# Patient Record
Sex: Male | Born: 1995 | Race: Black or African American | Hispanic: No | Marital: Single | Smoking: Never smoker
Health system: Southern US, Community
[De-identification: ages and names within clinical notes are randomized; demographics above are authoritative.]

## PROBLEM LIST (undated history)

## (undated) HISTORY — PX: OTHER SURGICAL HISTORY: SHX169

---

## 2016-11-28 ENCOUNTER — Encounter (HOSPITAL_COMMUNITY): Payer: Self-pay | Admitting: Emergency Medicine

## 2016-11-28 ENCOUNTER — Ambulatory Visit (HOSPITAL_COMMUNITY)
Admission: EM | Admit: 2016-11-28 | Discharge: 2016-11-28 | Disposition: A | Discharge status: Home / Self Care | Payer: BLUE CROSS/BLUE SHIELD | Attending: Internal Medicine | Admitting: Internal Medicine

## 2016-11-28 DIAGNOSIS — R5381 Other malaise: Secondary | ICD-10-CM | POA: Insufficient documentation

## 2016-11-28 DIAGNOSIS — H538 Other visual disturbances: Secondary | ICD-10-CM | POA: Diagnosis not present

## 2016-11-28 DIAGNOSIS — F430 Acute stress reaction: Secondary | ICD-10-CM

## 2016-11-28 DIAGNOSIS — R51 Headache: Secondary | ICD-10-CM | POA: Insufficient documentation

## 2016-11-28 DIAGNOSIS — R5383 Other fatigue: Secondary | ICD-10-CM | POA: Insufficient documentation

## 2016-11-28 LAB — CBC WITH DIFFERENTIAL/PLATELET
Basophils Absolute: 0 10*3/uL (ref 0.0–0.1)
Basophils Relative: 1 %
EOS ABS: 0.2 10*3/uL (ref 0.0–0.7)
Eosinophils Relative: 5 %
HCT: 42.9 % (ref 39.0–52.0)
HEMOGLOBIN: 14.8 g/dL (ref 13.0–17.0)
LYMPHS ABS: 1.7 10*3/uL (ref 0.7–4.0)
Lymphocytes Relative: 47 %
MCH: 30.2 pg (ref 26.0–34.0)
MCHC: 34.5 g/dL (ref 30.0–36.0)
MCV: 87.6 fL (ref 78.0–100.0)
MONOS PCT: 11 %
Monocytes Absolute: 0.4 10*3/uL (ref 0.1–1.0)
NEUTROS PCT: 36 %
Neutro Abs: 1.3 10*3/uL — ABNORMAL LOW (ref 1.7–7.7)
Platelets: 258 10*3/uL (ref 150–400)
RBC: 4.9 MIL/uL (ref 4.22–5.81)
RDW: 12.8 % (ref 11.5–15.5)
WBC: 3.5 10*3/uL — ABNORMAL LOW (ref 4.0–10.5)

## 2016-11-28 LAB — COMPREHENSIVE METABOLIC PANEL
ALK PHOS: 95 U/L (ref 38–126)
ALT: 32 U/L (ref 17–63)
ANION GAP: 8 (ref 5–15)
AST: 24 U/L (ref 15–41)
Albumin: 4.6 g/dL (ref 3.5–5.0)
BILIRUBIN TOTAL: 0.6 mg/dL (ref 0.3–1.2)
BUN: 15 mg/dL (ref 6–20)
CALCIUM: 10.1 mg/dL (ref 8.9–10.3)
CO2: 28 mmol/L (ref 22–32)
Chloride: 103 mmol/L (ref 101–111)
Creatinine, Ser: 0.96 mg/dL (ref 0.61–1.24)
GFR calc non Af Amer: >60 mL/min (ref >60)
Glucose, Bld: 73 mg/dL (ref 65–99)
Potassium: 4.4 mmol/L (ref 3.5–5.1)
SODIUM: 139 mmol/L (ref 135–145)
TOTAL PROTEIN: 7.9 g/dL (ref 6.5–8.1)

## 2016-11-28 LAB — RAPID HIV SCREEN (HIV 1/2 AB+AG)
HIV 1/2 ANTIBODIES: NONREACTIVE
HIV-1 P24 ANTIGEN - HIV24: NONREACTIVE

## 2016-11-28 LAB — TSH: TSH: 1.088 u[IU]/mL (ref 0.350–4.500)

## 2016-11-28 NOTE — Discharge Instructions (Addendum)
Lab results will be back in the next day or two.  We will contact you if any change in therapy is needed.  Be sure to eat regular meals and try to get some exercise a few days/week.  This will help you sleep better.  Avoid reliance on substances to relax.  Establish care with a primary care provider to guide further evaluation of your symptoms.

## 2016-11-28 NOTE — ED Provider Notes (Signed)
[facility]    CSN: [id number] Arrival date & time: 11/28/16  1344     History   Chief Complaint Chief Complaint  Patient presents with  . Fatigue    HPI Kevin Spencer is a 21 y.o. male. Presents today with a couple months history of gradual onset of malaise and fatigue, actually he says he's been tired since he got to college. Equivocal weight loss, thinks he might have lost 2 or 3 pounds. In the last month or 2 he has taken on one and then another job in addition to his course studies. He has trouble sleeping sometimes and feels a little tense. Sometimes he only eats once or twice a day. Occasional headaches, not severe. Just broke up with his girlfriend, long distance relationship. Not particularly feeling sad, although occasional moments of sadness. No suicidal or homicidal ideation. Equivocal thirst. Equivocal blurry vision.    HPI  History reviewed. No pertinent past medical history.  Past Surgical History:  Procedure Laterality Date  . testicular          Home Medications    Takes no meds regularly  Family History History reviewed. No pertinent family history.  Social History Social History  Substance Use Topics  . Smoking status: Never Smoker  . Smokeless tobacco: Never Used  . Alcohol use No     Allergies   Patient has no known allergies.   Review of Systems Review of Systems  All other systems reviewed and are negative.    Physical Exam Triage Vital Signs ED Triage Vitals [11/28/16 1403]  Enc Vitals Group     BP (!) 99/46     Pulse Rate 75     Resp 16     Temp 98 F (36.7 C)     Temp Source Oral     SpO2 100 %     Weight      Height      Pain Score 0   Updated Vital Signs BP (!) 99/46 (BP Location: Right Arm)   Pulse 75   Temp 98 F (36.7 C) (Oral)   Resp 16   SpO2 100%   Physical Exam  Constitutional: He is oriented to person, place, and time. No distress.  Alert, nicely groomed  HENT:  Head: Atraumatic.    Eyes:  Conjugate gaze, no eye redness/drainage  Neck: Neck supple.  Cardiovascular: Normal rate and regular rhythm.   Pulmonary/Chest: No respiratory distress. He has no wheezes. He has no rales.  Lungs clear, symmetric breath sounds  Abdominal: Soft. He exhibits no distension. There is no tenderness. There is no guarding.  Musculoskeletal: Normal range of motion.  Neurological: He is alert and oriented to person, place, and time.  Walked into the urgent care independently, able to climb on/off the exam table. Face is symmetric, speech is clear/coherent  Skin: Skin is warm and dry.  No cyanosis  Nursing note and vitals reviewed.    UC Treatments / Results  Labs  Results for orders placed or performed during the hospital encounter of 11/28/16  CBC with Differential  Result Value Ref Range   WBC 3.5 (L) 4.0 - 10.5 K/uL   RBC 4.90 4.22 - 5.81 MIL/uL   Hemoglobin 14.8 13.0 - 17.0 g/dL   HCT 82.942.9 56.239.0 - 13.052.0 %   MCV 87.6 78.0 - 100.0 fL   MCH 30.2 26.0 - 34.0 pg   MCHC 34.5 30.0 - 36.0 g/dL   RDW 86.512.8 78.411.5 - 69.615.5 %  Platelets 258 150 - 400 K/uL   Neutrophils Relative % 36 %   Neutro Abs 1.3 (L) 1.7 - 7.7 K/uL   Lymphocytes Relative 47 %   Lymphs Abs 1.7 0.7 - 4.0 K/uL   Monocytes Relative 11 %   Monocytes Absolute 0.4 0.1 - 1.0 K/uL   Eosinophils Relative 5 %   Eosinophils Absolute 0.2 0.0 - 0.7 K/uL   Basophils Relative 1 %   Basophils Absolute 0.0 0.0 - 0.1 K/uL  Comprehensive metabolic panel  Result Value Ref Range   Sodium 139 135 - 145 mmol/L   Potassium 4.4 3.5 - 5.1 mmol/L   Chloride 103 101 - 111 mmol/L   CO2 28 22 - 32 mmol/L   Glucose, Bld 73 65 - 99 mg/dL   BUN 15 6 - 20 mg/dL   Creatinine, Ser 4.09 0.61 - 1.24 mg/dL   Calcium 81.1 8.9 - 91.4 mg/dL   Total Protein 7.9 6.5 - 8.1 g/dL   Albumin 4.6 3.5 - 5.0 g/dL   AST 24 15 - 41 U/L   ALT 32 17 - 63 U/L   Alkaline Phosphatase 95 38 - 126 U/L   Total Bilirubin 0.6 0.3 - 1.2 mg/dL   GFR calc non Af Amer  >60 >60 mL/min   GFR calc Af Amer >60 >60 mL/min   Anion gap 8 5 - 15  Rapid HIV screen (HIV 1/2 Ab+Ag)  Result Value Ref Range   HIV-1 P24 Antigen - HIV24 NON REACTIVE NON REACTIVE   HIV 1/2 Antibodies NON REACTIVE NON REACTIVE   Interpretation (HIV Ag Ab)      A non reactive test result means that HIV 1 or HIV 2 antibodies and HIV 1 p24 antigen were not detected in the specimen.  TSH  Result Value Ref Range   TSH 1.088 0.350 - 4.500 uIU/mL    Procedures Procedures (including critical care time) None today   Final Clinical Impressions(s) / UC Diagnoses   Final diagnoses:  Acute reaction to situational stress  Fatigue, unspecified type   Lab results will be back in the next day or two.  We will contact you if any change in therapy is needed.  Be sure to eat regular meals and try to get some exercise a few days/week.  This will help you sleep better.  Avoid reliance on substances to relax.  Establish care with a primary care provider to guide further evaluation of your symptoms.      Eustace Moore, MD 11/29/16 [phone]

## 2016-11-28 NOTE — ED Triage Notes (Signed)
[facility] with a complaint of possible weight loss, fatigue and general malaise x 2 months.

## 2016-11-28 NOTE — ED Notes (Signed)
Verified patient's phone number.

## 2017-06-29 ENCOUNTER — Emergency Department (HOSPITAL_COMMUNITY)
Admission: EM | Admit: 2017-06-29 | Discharge: 2017-06-30 | Disposition: A | Discharge status: Home / Self Care | Payer: BLUE CROSS/BLUE SHIELD | Attending: Emergency Medicine | Admitting: Emergency Medicine

## 2017-06-29 ENCOUNTER — Encounter (HOSPITAL_COMMUNITY): Payer: Self-pay | Admitting: Emergency Medicine

## 2017-06-29 DIAGNOSIS — J069 Acute upper respiratory infection, unspecified: Secondary | ICD-10-CM | POA: Diagnosis not present

## 2017-06-29 DIAGNOSIS — R519 Headache, unspecified: Secondary | ICD-10-CM

## 2017-06-29 DIAGNOSIS — R51 Headache: Secondary | ICD-10-CM | POA: Diagnosis present

## 2017-06-29 LAB — CBC
HEMATOCRIT: 39.4 % (ref 39.0–52.0)
Hemoglobin: 14 g/dL (ref 13.0–17.0)
MCH: 30.6 pg (ref 26.0–34.0)
MCHC: 35.5 g/dL (ref 30.0–36.0)
MCV: 86 fL (ref 78.0–100.0)
PLATELETS: 302 10*3/uL (ref 150–400)
RBC: 4.58 MIL/uL (ref 4.22–5.81)
RDW: 12.3 % (ref 11.5–15.5)
WBC: 10.5 10*3/uL (ref 4.0–10.5)

## 2017-06-29 MED ORDER — SODIUM CHLORIDE 0.9 % IV BOLUS (SEPSIS)
1000.0000 mL | Freq: Once | INTRAVENOUS | Status: AC
  Administered 2017-06-29: 1000 mL via INTRAVENOUS

## 2017-06-29 MED ORDER — KETOROLAC TROMETHAMINE 30 MG/ML IJ SOLN
30.0000 mg | Freq: Once | INTRAMUSCULAR | Status: AC
  Administered 2017-06-29: 30 mg via INTRAVENOUS
  Filled 2017-06-29: qty 1

## 2017-06-29 MED ORDER — DIPHENHYDRAMINE HCL 50 MG/ML IJ SOLN
25.0000 mg | Freq: Once | INTRAMUSCULAR | Status: AC
Start: 2017-06-29 — End: 2017-06-29
  Administered 2017-06-29: 25 mg via INTRAVENOUS
  Filled 2017-06-29: qty 1

## 2017-06-29 MED ORDER — METOCLOPRAMIDE HCL 5 MG/ML IJ SOLN
10.0000 mg | INTRAMUSCULAR | Status: AC
  Administered 2017-06-29: 10 mg via INTRAVENOUS
  Filled 2017-06-29: qty 2

## 2017-06-29 NOTE — ED Triage Notes (Signed)
Pt complaint of headache with associated nausea, fever, and light sensitivity onset yesterday.

## 2017-06-29 NOTE — ED Notes (Signed)
ED Provider at bedside. 

## 2017-06-29 NOTE — ED Provider Notes (Signed)
[facility] Provider Note   CSN: [id number] Arrival date & time: 06/29/17  1559     History   Chief Complaint Chief Complaint  Patient presents with  . Headache    HPI Kevin Spencer is a 21 y.o. male.  21 year old male presents to the emergency department for evaluation of headache. He reports a frontal and temporal, throbbing headache which has been constant since yesterday. The patient has been taking aspirin for symptoms without relief. He notes associated photophobia and phonophobia. Symptoms preceded by upper respiratory infection. He notes onset of nasal congestion 5 days ago. This was followed by a fever up to 103F. He had development of a cough as well as a sore throat. He has tried over-the-counter remedies for this without relief. Patient denies any fever over 100.68F since onset of headache yesterday. He has had no vision changes, vision loss, hearing changes, vomiting, extremity numbness or paresthesias, extremity weakness. He does report a hx of migraines, but states this feels worse.      History reviewed. No pertinent past medical history.  There are no active problems to display for this patient.   Past Surgical History:  Procedure Laterality Date  . testicular         Home Medications    Prior to Admission medications   Medication Sig Start Date End Date Taking? Authorizing Provider  benzonatate (TESSALON) 100 MG capsule Take 1 capsule (100 mg total) by mouth 3 (three) times daily as needed for cough. 06/30/17   Antony Madura, PA-C  butalbital-acetaminophen-caffeine (FIORICET, ESGIC) [phone] MG tablet Take 1-2 tablets by mouth every 8 (eight) hours as needed for headache. 06/30/17 06/30/18  Antony Madura, PA-C  cetirizine (ZYRTEC ALLERGY) 10 MG tablet Take 1 tablet (10 mg total) by mouth daily. 06/30/17   Antony Madura, PA-C    Family History No family history on file.  Social History Social History  Substance Use Topics  . Smoking status: Never  Smoker  . Smokeless tobacco: Never Used  . Alcohol use No     Allergies   Patient has no known allergies.   Review of Systems Review of Systems Ten systems reviewed and are negative for acute change, except as noted in the HPI.    Physical Exam Updated Vital Signs BP 124/70 (BP Location: Right Arm)   Pulse 92   Temp (!) 100.6 F (38.1 C) (Oral)   Resp 16   SpO2 99%   Physical Exam  Constitutional: He is oriented to person, place, and time. He appears well-developed and well-nourished. No distress.  Appears uncomfortable, but nontoxic.  HENT:  Head: Normocephalic and atraumatic.  Mouth/Throat: Oropharynx is clear and moist.  Symmetric rise of the uvula with phonation  Eyes: Pupils are equal, round, and reactive to light. Conjunctivae and EOM are normal. No scleral icterus.  Neck: Normal range of motion.  No meningismus; moving neck freely  Cardiovascular: Regular rhythm and intact distal pulses.   Mild tachycardia  Pulmonary/Chest: Effort normal. No respiratory distress. He has no wheezes.  Respirations even and unlabored  Musculoskeletal: Normal range of motion.  Neurological: He is alert and oriented to person, place, and time. No cranial nerve deficit. He exhibits normal muscle tone. Coordination normal.  GCS 15. Speech is goal oriented. No cranial nerve deficits appreciated; symmetric eyebrow raise, no facial drooping, tongue midline. Patient has equal grip strength bilaterally with 5/5 strength against resistance in all major muscle groups bilaterally. Sensation to light touch intact. Patient moves extremities without ataxia.  Skin: Skin is warm and dry. No rash noted. He is not diaphoretic. No erythema. No pallor.  Psychiatric: He has a normal mood and affect. His behavior is normal.  Nursing note and vitals reviewed.    ED Treatments / Results  Labs (all labs ordered are listed, but only abnormal results are displayed) Labs Reviewed  BASIC METABOLIC PANEL -  Abnormal; Notable for the following:       Result Value   Sodium 134 (*)    Chloride 99 (*)    Glucose, Bld 107 (*)    All other components within normal limits  CBC    EKG  EKG Interpretation None       Radiology No results found.  Procedures Procedures (including critical care time)  Medications Ordered in ED Medications  sodium chloride 0.9 % bolus 1,000 mL (0 mLs Intravenous Stopped 06/30/17 0045)  ketorolac (TORADOL) 30 MG/ML injection 30 mg (30 mg Intravenous Given 06/29/17 2336)  metoCLOPramide (REGLAN) injection 10 mg (10 mg Intravenous Given 06/29/17 2338)  diphenhydrAMINE (BENADRYL) injection 25 mg (25 mg Intravenous Given 06/29/17 2337)  butalbital-acetaminophen-caffeine (FIORICET, ESGIC) 50-325-40 MG per tablet 2 tablet (2 tablets Oral Given 06/30/17 0158)     Initial Impression / Assessment and Plan / ED Course  I have reviewed the triage vital signs and the nursing notes.  Pertinent labs & imaging results that were available during my care of the patient were reviewed by me and considered in my medical decision making (see chart for details).     21 year old male presents to the emergency department for fever, nasal congestion, cough, body aches, and headache. Patient complaining of a migraine in his frontal and temporal region with associated photophobia and phonophobia. Neurologic exam nonfocal. No meningismus or nuchal rigidity on exam. Patient has had significant improvement in his headache with a migraine cocktail and IV fluids. On reassessment, he is nontoxic in appearance and smiling; in NAD. Symptoms c/w flu-like illness. Will continue with further supportive care. Low suspicion for emergent etiology of symptoms at this time. Return precautions discussed and provided. Patient discharged in stable condition with no unaddressed concerns.   Final Clinical Impressions(s) / ED Diagnoses   Final diagnoses:  Viral URI  Bad headache    New  Prescriptions Discharge Medication List as of 06/30/2017  1:39 AM    START taking these medications   Details  benzonatate (TESSALON) 100 MG capsule Take 1 capsule (100 mg total) by mouth 3 (three) times daily as needed for cough., Starting Fri 06/30/2017, Print    butalbital-acetaminophen-caffeine (FIORICET, ESGIC) 50-325-40 MG tablet Take 1-2 tablets by mouth every 8 (eight) hours as needed for headache., Starting Fri 06/30/2017, Until Sat 06/30/2018, Print    cetirizine (ZYRTEC ALLERGY) 10 MG tablet Take 1 tablet (10 mg total) by mouth daily., Starting Fri 06/30/2017, Print         Antony Madura, PA-C 06/30/17 1610    Marily Memos, MD 07/03/17 [phone]

## 2017-06-29 NOTE — ED Notes (Signed)
Pt did not respond when called form triage

## 2017-06-30 ENCOUNTER — Encounter (HOSPITAL_COMMUNITY): Payer: Self-pay

## 2017-06-30 DIAGNOSIS — R51 Headache: Secondary | ICD-10-CM | POA: Insufficient documentation

## 2017-06-30 DIAGNOSIS — J019 Acute sinusitis, unspecified: Secondary | ICD-10-CM

## 2017-06-30 LAB — BASIC METABOLIC PANEL
Anion gap: 11 (ref 5–15)
BUN: 16 mg/dL (ref 6–20)
CHLORIDE: 99 mmol/L — AB (ref 101–111)
CO2: 24 mmol/L (ref 22–32)
CREATININE: 1.06 mg/dL (ref 0.61–1.24)
Calcium: 9.4 mg/dL (ref 8.9–10.3)
Glucose, Bld: 107 mg/dL — ABNORMAL HIGH (ref 65–99)
POTASSIUM: 4.2 mmol/L (ref 3.5–5.1)
SODIUM: 134 mmol/L — AB (ref 135–145)

## 2017-06-30 MED ORDER — CETIRIZINE HCL 10 MG PO TABS: 10.0000 mg | ORAL_TABLET | Freq: Every day | ORAL | 1 refills | Status: AC

## 2017-06-30 MED ORDER — BENZONATATE 100 MG PO CAPS: 100.0000 mg | ORAL_CAPSULE | Freq: Three times a day (TID) | ORAL | 0 refills | Status: AC | PRN

## 2017-06-30 MED ORDER — ACETAMINOPHEN 325 MG PO TABS
650.0000 mg | ORAL_TABLET | Freq: Once | ORAL | Status: AC | PRN
  Administered 2017-06-30: 650 mg via ORAL
  Filled 2017-06-30: qty 2

## 2017-06-30 MED ORDER — BUTALBITAL-APAP-CAFFEINE 50-325-40 MG PO TABS: 1.0000 | ORAL_TABLET | Freq: Three times a day (TID) | ORAL | 0 refills | Status: AC | PRN

## 2017-06-30 MED ORDER — BUTALBITAL-APAP-CAFFEINE 50-325-40 MG PO TABS
2.0000 | ORAL_TABLET | Freq: Once | ORAL | Status: AC
  Administered 2017-06-30: 2 via ORAL
  Filled 2017-06-30: qty 2

## 2017-06-30 NOTE — ED Triage Notes (Signed)
Pt complaining of continued headache, fever, and light sensitivity. He was seen here last night for same and given a Fioricet prescription. He last took that at 8pm. He states that that takes his pain from at 10 to a 5, but it only lasts about 4 hours. A&Ox4. Ambulatory.

## 2017-07-01 ENCOUNTER — Emergency Department (HOSPITAL_COMMUNITY): Payer: BLUE CROSS/BLUE SHIELD

## 2017-07-01 ENCOUNTER — Emergency Department (HOSPITAL_COMMUNITY)
Admission: EM | Admit: 2017-07-01 | Discharge: 2017-07-01 | Disposition: A | Discharge status: Home / Self Care | Payer: BLUE CROSS/BLUE SHIELD | Source: Home / Self Care | Attending: Emergency Medicine | Admitting: Emergency Medicine

## 2017-07-01 DIAGNOSIS — R51 Headache: Principal | ICD-10-CM

## 2017-07-01 DIAGNOSIS — J019 Acute sinusitis, unspecified: Secondary | ICD-10-CM

## 2017-07-01 DIAGNOSIS — R519 Headache, unspecified: Secondary | ICD-10-CM

## 2017-07-01 MED ORDER — AMOXICILLIN 500 MG PO CAPS: 500.0000 mg | ORAL_CAPSULE | Freq: Three times a day (TID) | ORAL | 0 refills | Status: AC

## 2017-07-01 NOTE — Discharge Instructions (Signed)
It was our pleasure to provide your ER care today - we hope that you feel better.  Take amoxicillin as prescribed.  You may try zyrtec d (available over the counter) for symptom relief.  Take ibuprofen and/or acetaminophen as need for pain.  Follow up with primary care doctor in 1 week if symptoms fail to improve/resolve.  Return to ER if worse, other concern.

## 2017-07-01 NOTE — ED Provider Notes (Signed)
[facility] Provider Note   CSN: [id number] Arrival date & time: 06/30/17  2024     History   Chief Complaint Chief Complaint  Patient presents with  . Headache    HPI Kevin Spencer is a 21 y.o. male.  Patient c/o recent uri symptoms with nasal congestion, drainage, scratchy throat, fevers, occasional non prod cough, and intermittent headaches. Patient c/o dull frontal area headaches. Moderate-sev. Dull. Non radiating. No specific exacerbating or alleviating factors. Hx migraines. No eye pain or change in vision. No neck pain or stiffness. No known ill contacts.    The history is provided by the patient.  Headache   Associated symptoms include a fever. Pertinent negatives include no shortness of breath and no vomiting.    History reviewed. No pertinent past medical history.  There are no active problems to display for this patient.   Past Surgical History:  Procedure Laterality Date  . testicular          Home Medications    Prior to Admission medications   Medication Sig Start Date End Date Taking? Authorizing Provider  butalbital-acetaminophen-caffeine (FIORICET, ESGIC) [phone] MG tablet Take 1-2 tablets by mouth every 8 (eight) hours as needed for headache. 06/30/17 06/30/18 Yes Antony Madura, PA-C  cetirizine (ZYRTEC ALLERGY) 10 MG tablet Take 1 tablet (10 mg total) by mouth daily. 06/30/17  Yes Antony Madura, PA-C  benzonatate (TESSALON) 100 MG capsule Take 1 capsule (100 mg total) by mouth 3 (three) times daily as needed for cough. 06/30/17   Antony Madura, PA-C    Family History History reviewed. No pertinent family history.  Social History Social History  Substance Use Topics  . Smoking status: Never Smoker  . Smokeless tobacco: Never Used  . Alcohol use No     Allergies   Patient has no known allergies.   Review of Systems Review of Systems  Constitutional: Positive for fever.  HENT: Positive for congestion, rhinorrhea, sinus pain and sore  throat.   Eyes: Negative for pain, discharge and redness.  Respiratory: Positive for cough. Negative for shortness of breath.   Cardiovascular: Negative for chest pain.  Gastrointestinal: Negative for abdominal pain and vomiting.  Genitourinary: Negative for flank pain.  Musculoskeletal: Negative for neck pain and neck stiffness.  Skin: Negative for rash.  Neurological: Positive for headaches.  Hematological: Does not bruise/bleed easily.  Psychiatric/Behavioral: Negative for confusion.     Physical Exam Updated Vital Signs BP (!) 111/59 (BP Location: Right Arm)   Pulse 67   Temp (!) 100.8 F (38.2 C) (Oral)   Resp 18   SpO2 100%   Physical Exam  Constitutional: He appears well-developed and well-nourished. No distress.  HENT:  Mouth/Throat: Oropharynx is clear and moist.  Nasal congestion. Frontal/max area tenderness. No sts. No skin changes or erythema.  tms normal.   Eyes: Pupils are equal, round, and reactive to light. Conjunctivae and EOM are normal.  Neck: Normal range of motion. Neck supple. No tracheal deviation present.  Pt moves neck freely and comfortably in all directions. No neck stiffness or rigidity.  Cardiovascular: Normal rate, regular rhythm, normal heart sounds and intact distal pulses.  Exam reveals no gallop and no friction rub.   No murmur heard. Pulmonary/Chest: Effort normal and breath sounds normal. No accessory muscle usage. No respiratory distress.  Abdominal: Soft. He exhibits no distension. There is no tenderness.  Musculoskeletal: He exhibits no edema.  Lymphadenopathy:    He has no cervical adenopathy.  Neurological: He is alert.  Speech clear/fluent. Motor intact bil. Steady gait.   Skin: Skin is warm and dry. No rash noted. He is not diaphoretic.  Psychiatric: He has a normal mood and affect.  Nursing note and vitals reviewed.    ED Treatments / Results  Labs (all labs ordered are listed, but only abnormal results are displayed) Labs  Reviewed - No data to display  EKG  EKG Interpretation None       Radiology Ct Head Wo Contrast  Result Date: 07/01/2017 CLINICAL DATA:  Continued headache.  Seen here yesterday for same. EXAM: CT HEAD WITHOUT CONTRAST TECHNIQUE: Contiguous axial images were obtained from the base of the skull through the vertex without intravenous contrast. COMPARISON:  None. FINDINGS: Brain: No evidence of acute infarction, hemorrhage, hydrocephalus, extra-axial collection or mass lesion/mass effect. Vascular: No hyperdense vessel or unexpected calcification. Skull: Normal. Negative for fracture or focal lesion. Sinuses/Orbits: Mucosal thickening in the paranasal sinuses. No acute air-fluid levels. Mastoid air cells are clear. Other: None. IMPRESSION: No acute intracranial abnormalities. Electronically Signed   By: Burman Nieves M.D.   On: 07/01/2017 03:38    Procedures Procedures (including critical care time)  Medications Ordered in ED Medications  acetaminophen (TYLENOL) tablet 650 mg (650 mg Oral Given 06/30/17 2207)     Initial Impression / Assessment and Plan / ED Course  I have reviewed the triage vital signs and the nursing notes.  Pertinent labs & imaging results that were available during my care of the patient were reviewed by me and considered in my medical decision making (see chart for details).  Motrin po. Po fluids.  2nd ED visit w same symptoms, requests imaging study.  Ct ordered.   Reviewed nursing notes and prior charts for additional history.    will give rx amox and rec zyrtec d for symptom relief.    Final Clinical Impressions(s) / ED Diagnoses   Final diagnoses:  None    New Prescriptions New Prescriptions   No medications on file     Cathren Laine, MD 07/01/17 [phone]

## 2017-11-25 DIAGNOSIS — S61211A Laceration without foreign body of left index finger without damage to nail, initial encounter: Secondary | ICD-10-CM | POA: Insufficient documentation

## 2017-11-25 DIAGNOSIS — W260XXA Contact with knife, initial encounter: Secondary | ICD-10-CM | POA: Diagnosis not present

## 2017-11-25 DIAGNOSIS — Z23 Encounter for immunization: Secondary | ICD-10-CM | POA: Insufficient documentation

## 2017-11-25 DIAGNOSIS — Y92511 Restaurant or cafe as the place of occurrence of the external cause: Secondary | ICD-10-CM | POA: Diagnosis not present

## 2017-11-25 DIAGNOSIS — Y93G1 Activity, food preparation and clean up: Secondary | ICD-10-CM | POA: Insufficient documentation

## 2017-11-25 DIAGNOSIS — Y990 Civilian activity done for income or pay: Secondary | ICD-10-CM | POA: Diagnosis not present

## 2017-11-26 ENCOUNTER — Encounter (HOSPITAL_COMMUNITY): Payer: Self-pay | Admitting: Nurse Practitioner

## 2017-11-26 ENCOUNTER — Emergency Department (HOSPITAL_COMMUNITY)
Admission: EM | Admit: 2017-11-26 | Discharge: 2017-11-26 | Disposition: A | Discharge status: Home / Self Care | Payer: Worker's Compensation | Attending: Emergency Medicine | Admitting: Emergency Medicine

## 2017-11-26 DIAGNOSIS — S61211A Laceration without foreign body of left index finger without damage to nail, initial encounter: Secondary | ICD-10-CM

## 2017-11-26 MED ORDER — IBUPROFEN 800 MG PO TABS
800.0000 mg | ORAL_TABLET | Freq: Once | ORAL | Status: AC
  Administered 2017-11-26: 800 mg via ORAL
  Filled 2017-11-26: qty 1

## 2017-11-26 MED ORDER — LIDOCAINE HCL (PF) 1 % IJ SOLN
5.0000 mL | Freq: Once | INTRAMUSCULAR | Status: AC
  Administered 2017-11-26: 5 mL via INTRADERMAL
  Filled 2017-11-26: qty 30

## 2017-11-26 MED ORDER — TETANUS-DIPHTH-ACELL PERTUSSIS 5-2.5-18.5 LF-MCG/0.5 IM SUSP
0.5000 mL | Freq: Once | INTRAMUSCULAR | Status: AC
  Administered 2017-11-26: 0.5 mL via INTRAMUSCULAR
  Filled 2017-11-26: qty 0.5

## 2017-11-26 NOTE — ED Notes (Signed)
AVS explained in detail. Knows to keep finger clean and dry and covered. Knows not to immerse in water or pick at finger. Acknowledges for which symptoms to return. Given work note. Pt is driving himself home. Received 800 mg Ibuprofen.

## 2017-11-26 NOTE — Discharge Instructions (Signed)
A laceration is a cut or lesion that goes through all layers of the skin and into the tissue just beneath the skin. This may have been repaired by your caregiver.  °SEEK MEDICAL ATTENTION IF: °There is redness, swelling, increasing pain in the wound  °There is a red line that goes up your arm or leg.  °Pus is coming from wound.  °You develop an unexplained temperature above 100.4.  °You notice a foul smell coming from the wound or dressing.  °There is a breaking open of the wound (edges not staying together) after sutures have been removed. °If you did not receive a tetanus shot today because you thought you were up to date, but did not recall when your last one was given, make sure to check with your primary caregiver to determine if you need one. ° °

## 2017-11-26 NOTE — ED Triage Notes (Signed)
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

## 2017-11-26 NOTE — ED Provider Notes (Signed)
[facility]-EMERGENCY DEPT Provider Note   CSN: [id number] Arrival date & time: 11/25/17  2251     History   Chief Complaint Chief Complaint  Patient presents with  . Laceration    Left index finger    HPI Kevin Spencer is a 22 y.o. male.  Who presents the emergency department chief complaint of finger laceration.  Patient states that he was preparing dinner about 8 PM last evening when he sliced the distal tip of his left index finger.  He had immediate pain and bleeding.  He washed the wound at home.  He is unsure of his last tetanus vaccination.  He denies any numbness or tingling.  HPI  History reviewed. No pertinent past medical history.  There are no active problems to display for this patient.   Past Surgical History:  Procedure Laterality Date  . testicular          Home Medications    Prior to Admission medications   Medication Sig Start Date End Date Taking? Authorizing Provider  amoxicillin (AMOXIL) 500 MG capsule Take 1 capsule (500 mg total) by mouth 3 (three) times daily. 07/01/17   Cathren LaineSteinl, Kevin, MD  benzonatate (TESSALON) 100 MG capsule Take 1 capsule (100 mg total) by mouth 3 (three) times daily as needed for cough. 06/30/17   Antony MaduraHumes, Kelly, PA-C  butalbital-acetaminophen-caffeine (FIORICET, ESGIC) [phone] MG tablet Take 1-2 tablets by mouth every 8 (eight) hours as needed for headache. 06/30/17 06/30/18  Antony MaduraHumes, Kelly, PA-C  cetirizine (ZYRTEC ALLERGY) 10 MG tablet Take 1 tablet (10 mg total) by mouth daily. 06/30/17   Antony MaduraHumes, Kelly, PA-C    Family History History reviewed. No pertinent family history.  Social History Social History   Tobacco Use  . Smoking status: Never Smoker  . Smokeless tobacco: Never Used  Substance Use Topics  . Alcohol use: No  . Drug use: Yes    Types: Marijuana     Allergies   Patient has no known allergies.   Review of Systems Review of Systems Ten systems reviewed and are negative for acute  change, except as noted in the HPI.    Physical Exam Updated Vital Signs BP 115/68 (BP Location: Right Arm)   Pulse 77   Temp 98 F (36.7 C) (Oral)   Resp 14   SpO2 98%   Physical Exam  Constitutional: He appears well-developed and well-nourished. No distress.  HENT:  Head: Normocephalic and atraumatic.  Eyes: Conjunctivae are normal. No scleral icterus.  Neck: Normal range of motion. Neck supple.  Cardiovascular: Normal rate, regular rhythm and normal heart sounds.  Pulmonary/Chest: Effort normal and breath sounds normal. No respiratory distress.  Abdominal: Soft. There is no tenderness.  Musculoskeletal: He exhibits no edema.  1 cm laceration tip of the left index finger on the radial side of the finger in a horizontal pattern.  Actively bleeding.  No nailbed involvement.  Neurological: He is alert.  Skin: Skin is warm and dry. He is not diaphoretic.  Psychiatric: His behavior is normal.  Nursing note and vitals reviewed.    ED Treatments / Results  Labs (all labs ordered are listed, but only abnormal results are displayed) Labs Reviewed - No data to display  EKG  EKG Interpretation None       Radiology No results found.  Procedures Procedures (including critical care time) LACERATION REPAIR Performed by: Arthor CaptainAbigail Chaston Bradburn Authorized by: Arthor CaptainAbigail Iden Stripling Consent: Verbal consent obtained. Risks and benefits: risks, benefits and alternatives were discussed  Consent given by: patient Patient identity confirmed: provided demographic data Prepped and Draped in normal sterile fashion Wound explored  Laceration Location: Left index finger  Laceration Length: 1 cm  No Foreign Bodies seen or palpated  Anesthesia: local infiltration  Local anesthetic: lidocaine 1 % without epinephrine  Anesthetic total: 5 ml-MCP block  Irrigation method: syringe Amount of cleaning: standard  Skin closure: 5.0 Vicryl  Number of sutures: 6  Technique: Running  interlocking  Patient tolerance: Patient tolerated the procedure well with no immediate complications.  Medications Ordered in ED Medications  lidocaine (PF) (XYLOCAINE) 1 % injection 5 mL (not administered)  Tdap (BOOSTRIX) injection 0.5 mL (not administered)     Initial Impression / Assessment and Plan / ED Course  I have reviewed the triage vital signs and the nursing notes.  Pertinent labs & imaging results that were available during my care of the patient were reviewed by me and considered in my medical decision making (see chart for details).     Kevin Spencer is a 22 y.o. male who presents to ED for laceration of Left index. Wound thoroughly cleaned in ED today. Wound explored and bottom of wound seen in a bloodless field. Laceration repaired as dictated above. Patient counseled on home wound care.Patient was urged to return to the Emergency Department for worsening pain, swelling, expanding erythema especially if it streaks away from the affected area, fever, or for any additional concerns. Patient verbalized understanding. All questions answered.   Final Clinical Impressions(s) / ED Diagnoses   Final diagnoses:  Laceration of left index finger without foreign body without damage to nail, initial encounter    ED Discharge Orders    None       Arthor Captain, PA-C 11/26/17 0520    Dione Booze, MD 11/26/17 [phone]

## 2019-03-07 IMAGING — CT CT HEAD W/O CM
3 of 4 series · 15 of 47 positions shown, 18 images · non-contrast
Comparison: None.

CLINICAL DATA: Continued headache.  Seen here yesterday for same.

EXAM:
CT HEAD WITHOUT CONTRAST
TECHNIQUE: Contiguous axial images were obtained from the base of the skull
through the vertex without intravenous contrast.

[Series 2: head w/o · axial · non-contrast · 0.45mm/px · z∈[-155,-35]mm · 9 of 29 slices shown, 12 images]
[im 3/29  brain]
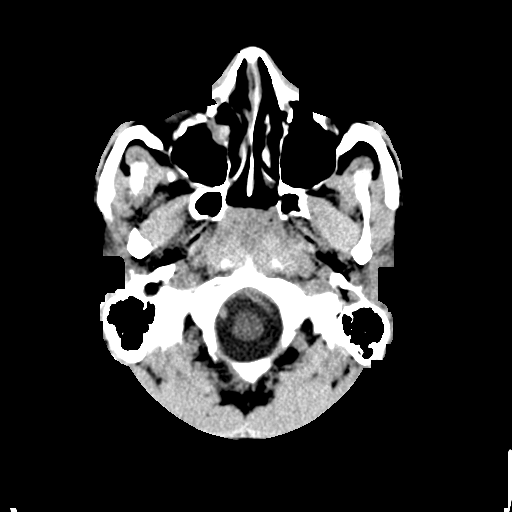
[im 3/29  bone]
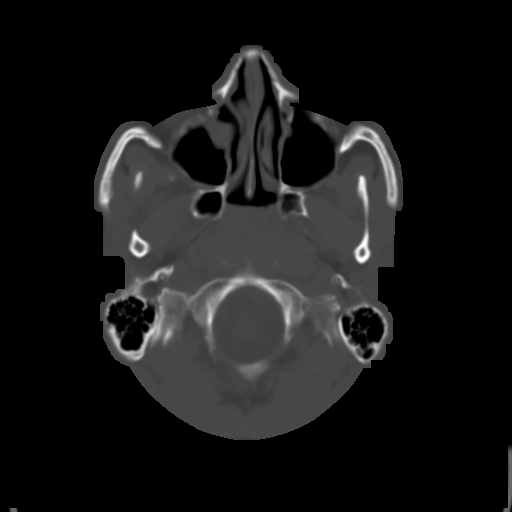
[im 7/29  brain]
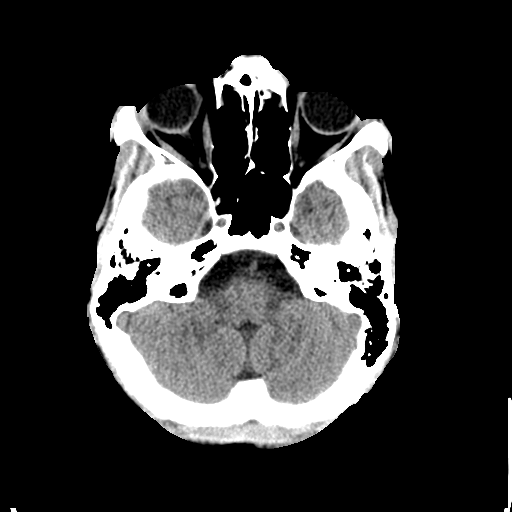
[im 9/29  brain]
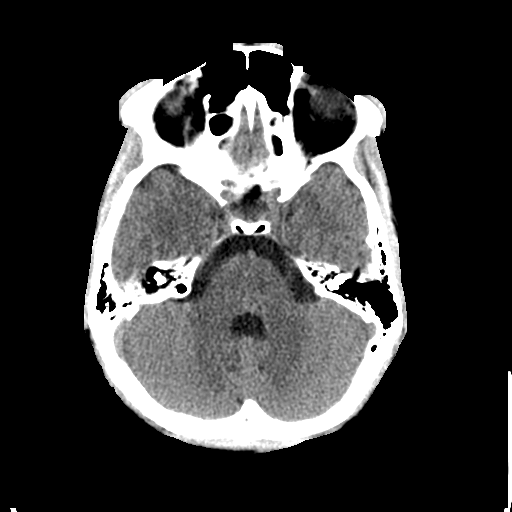
[im 13/29  brain]
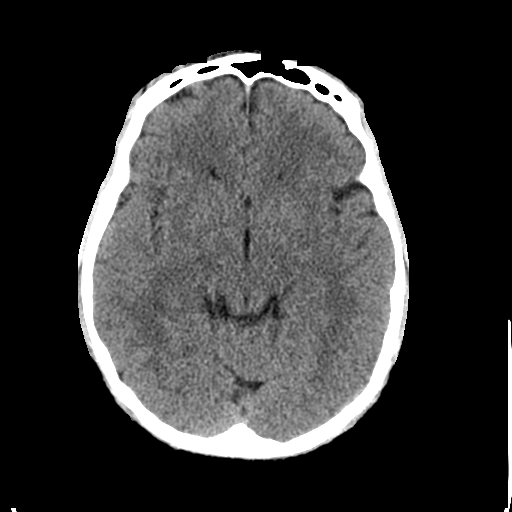
[im 15/29  brain]
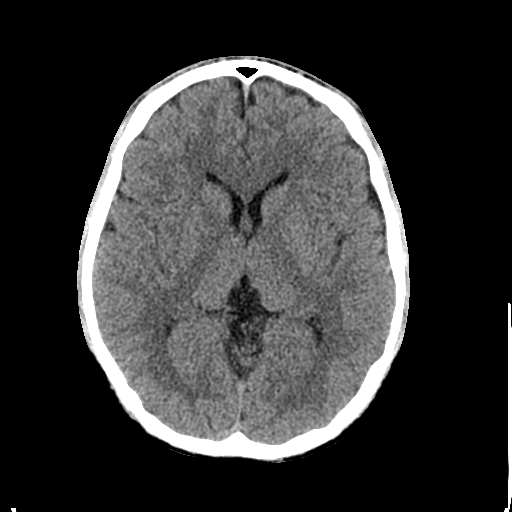
[im 15/29  bone]
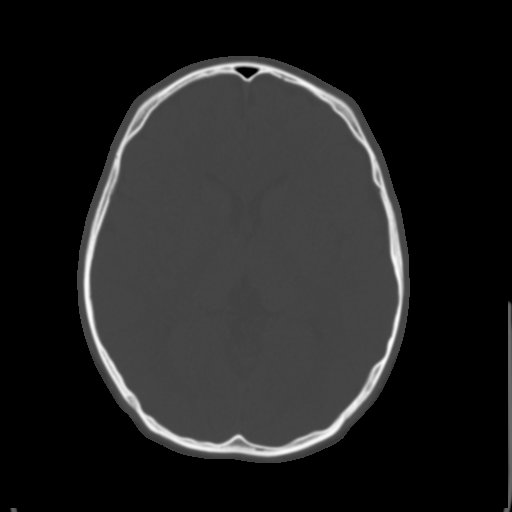
[im 17/29  brain]
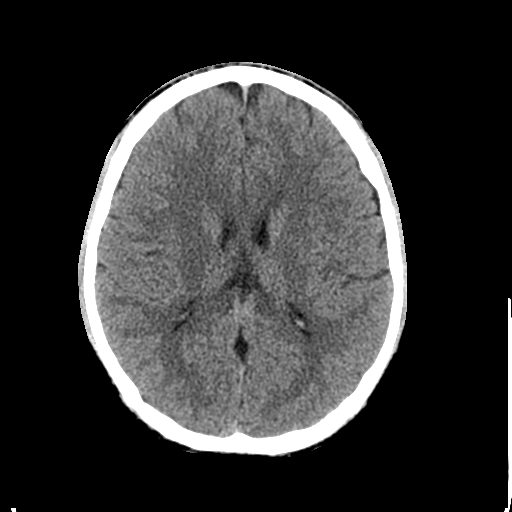
[im 21/29  brain]
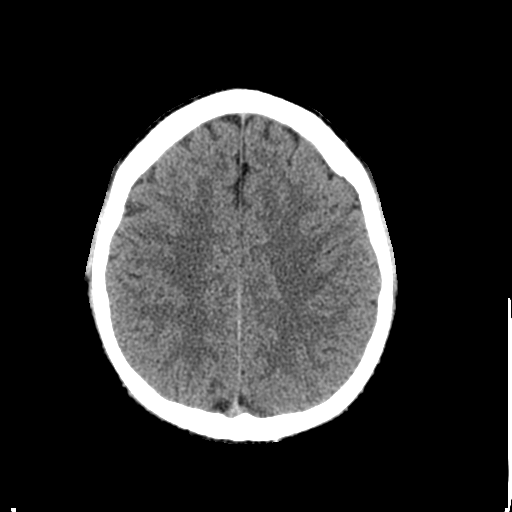
[im 23/29  brain]
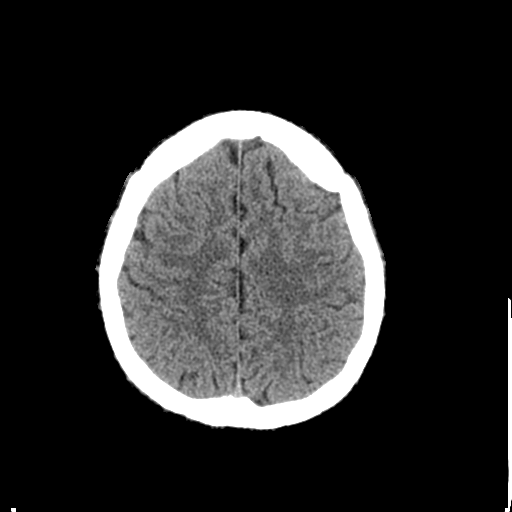
[im 27/29  brain]
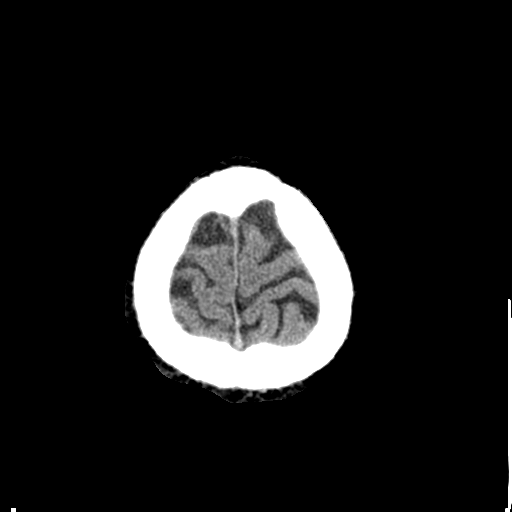
[im 27/29  bone]
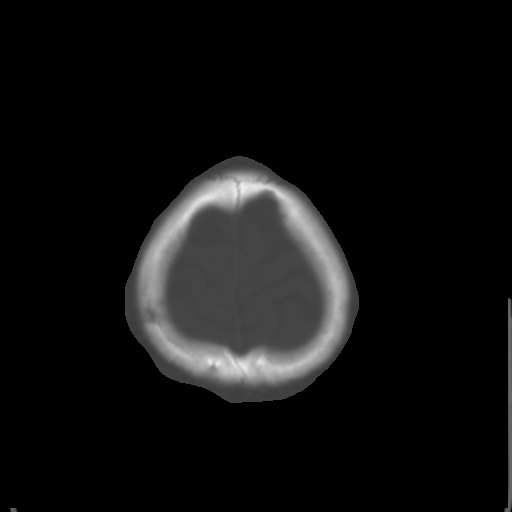

[Series 5: coronal · coronal · 0.28mm/px · 3 of 62 slices shown]
[im 21/62  brain]
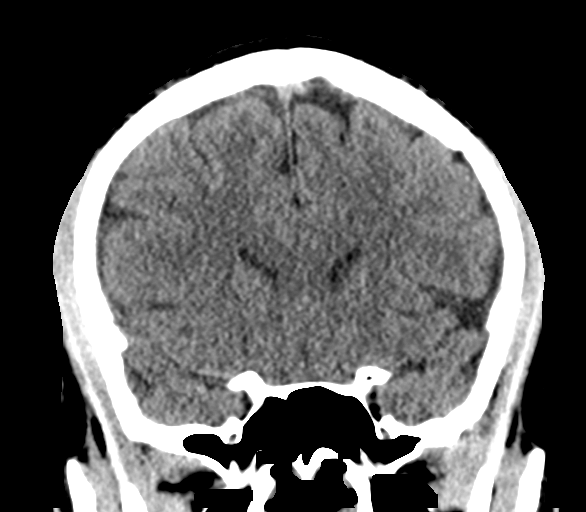
[im 28/62  brain]
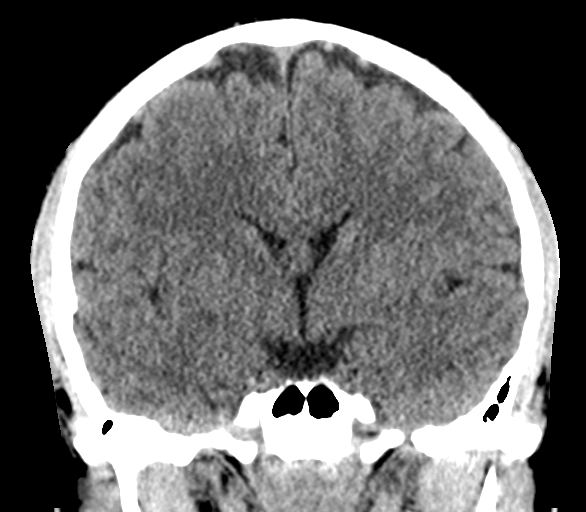
[im 34/62  brain]
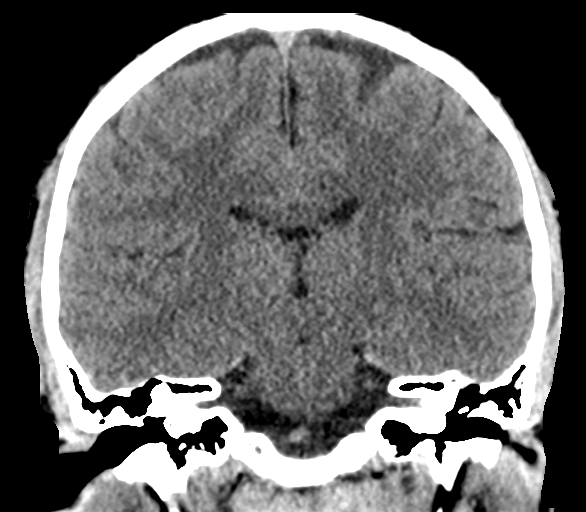

[Series 6: sagittal · sagittal · 0.31mm/px · 3 of 50 slices shown]
[im 17/50  brain]
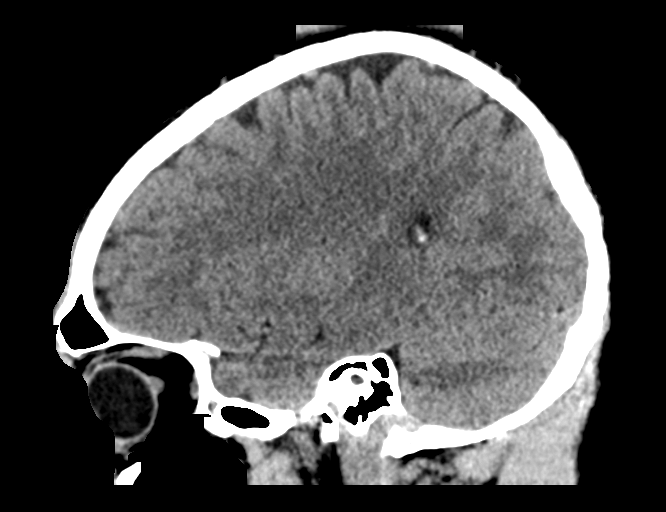
[im 25/50  brain]
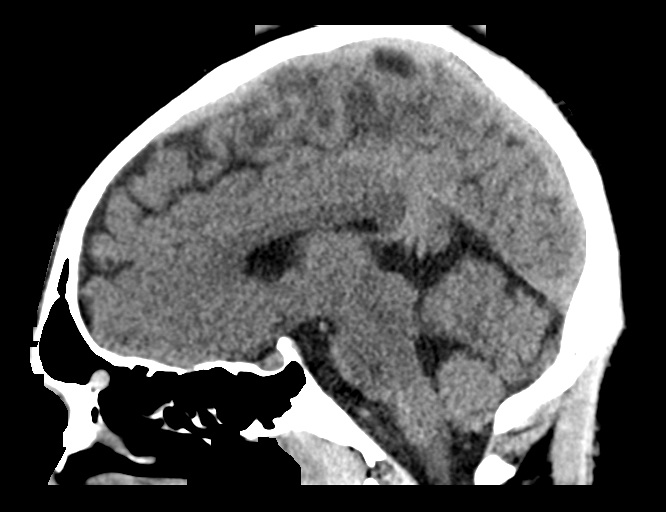
[im 33/50  brain]
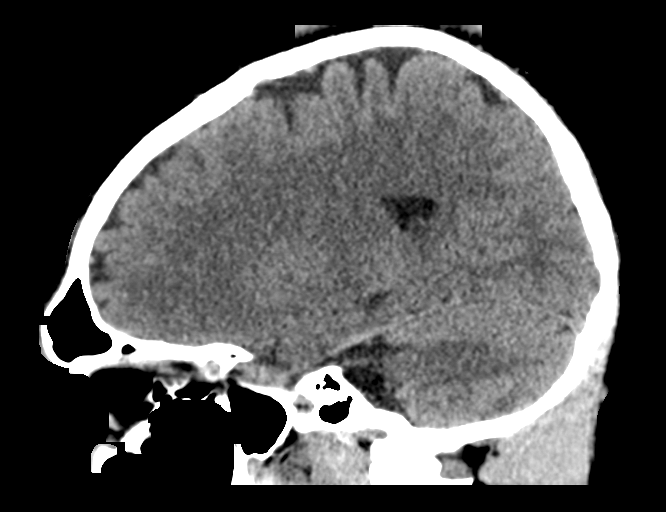

[15 of 47 positions shown; findings below may reference images not displayed]

FINDINGS: Brain: No evidence of acute infarction, hemorrhage, hydrocephalus,
extra-axial collection or mass lesion/mass effect.

Vascular: No hyperdense vessel or unexpected calcification.

Skull: Normal. Negative for fracture or focal lesion.

Sinuses/Orbits: Mucosal thickening in the paranasal sinuses. No
acute air-fluid levels. Mastoid air cells are clear.

Other: None.
IMPRESSION: No acute intracranial abnormalities.
